# Patient Record
Sex: Male | Born: 1960 | Race: White | Hispanic: No | Smoking: Never smoker
Health system: Southern US, Community
[De-identification: ages and names within clinical notes are randomized; demographics above are authoritative.]

## PROBLEM LIST (undated history)

## (undated) DIAGNOSIS — I10 Essential (primary) hypertension: Secondary | ICD-10-CM

## (undated) DIAGNOSIS — E119 Type 2 diabetes mellitus without complications: Secondary | ICD-10-CM

## (undated) HISTORY — PX: BACK SURGERY: SHX140

## (undated) HISTORY — PX: TONSILLECTOMY: SUR1361

---

## 2007-12-24 ENCOUNTER — Ambulatory Visit (HOSPITAL_COMMUNITY): Admission: RE | Admit: 2007-12-24 | Discharge: 2007-12-25 | Payer: Self-pay | Admitting: Anesthesiology

## 2009-02-19 IMAGING — CR DG LUMBAR SPINE 2-3V
1 series · 1 of 1 positions shown · non-contrast
Comparison: none

CLINICAL DATA: L5-S1 diskectomy.
 PORTABLE LATERAL LUMBAR SPINE - 1 VIEW:

[view not recorded]
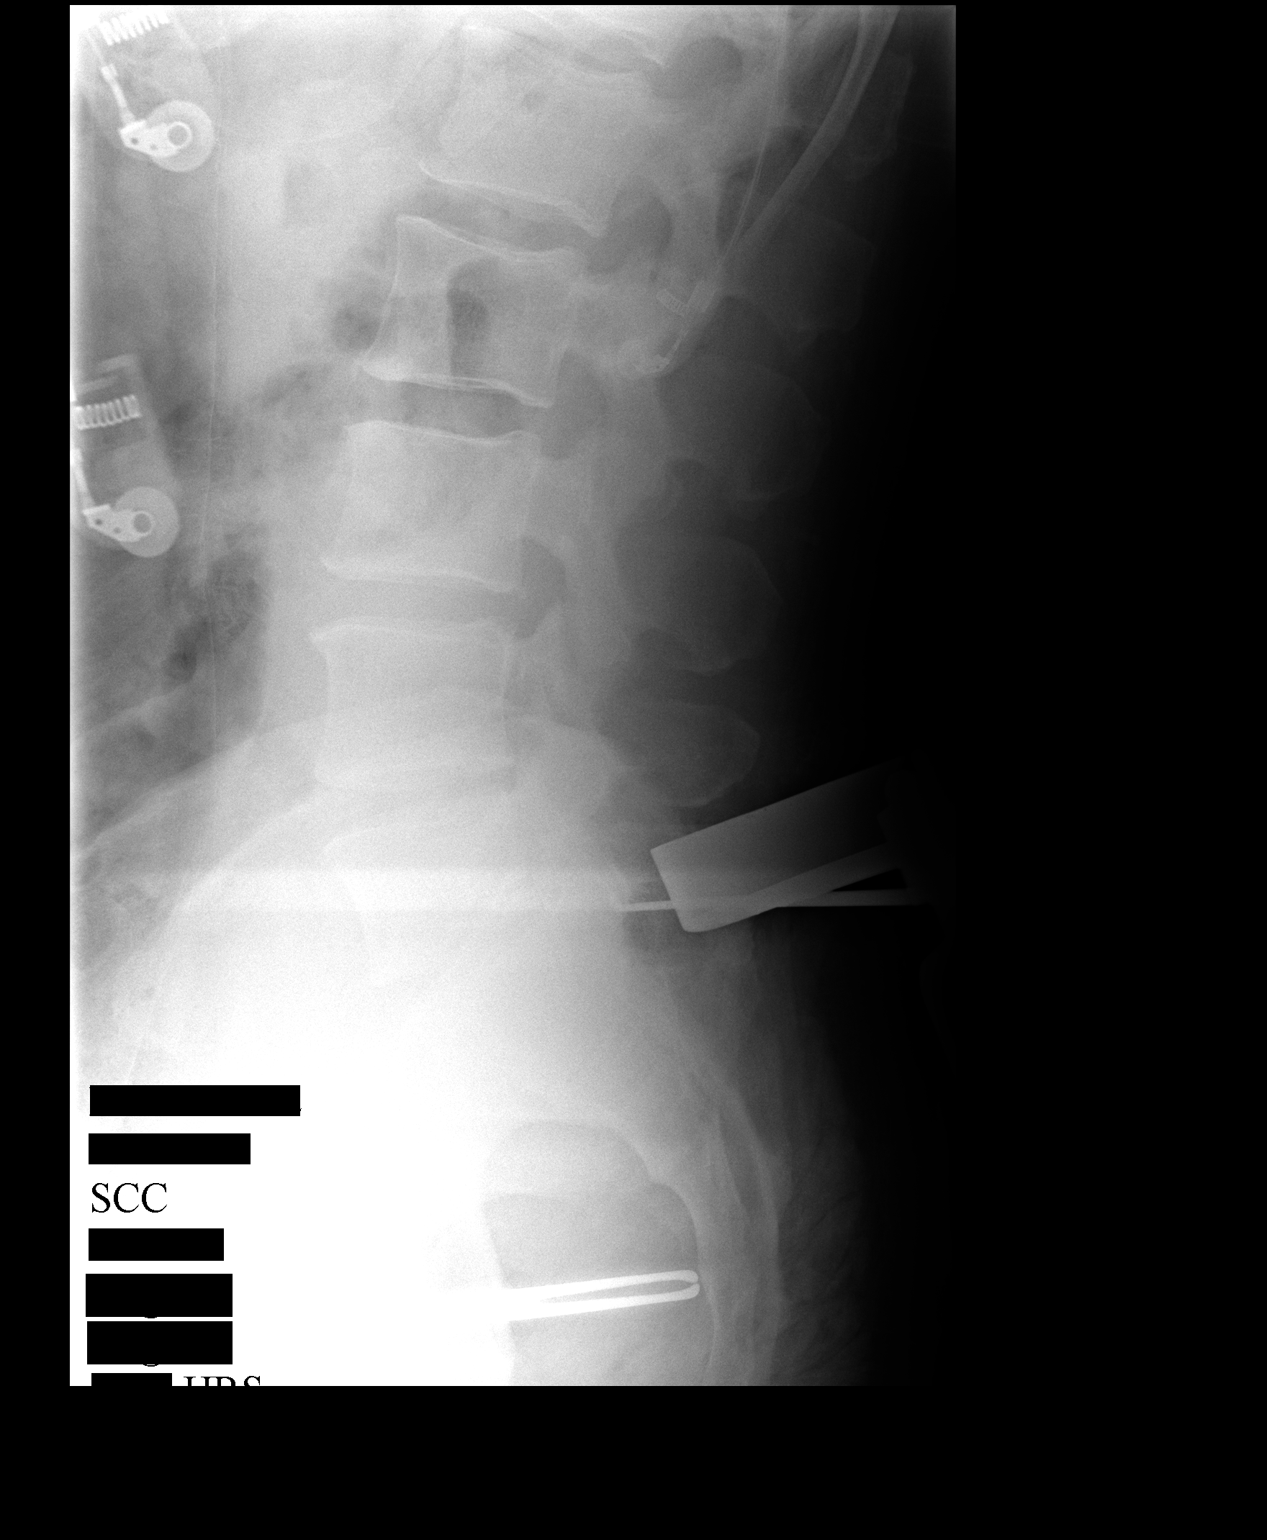

[1 of 1 positions shown; findings below may reference images not displayed]

FINDINGS: A single film shows tissue spreaders in place posteriorly with a probe directed at the last disc space level.
IMPRESSION: As discussed above.

## 2011-04-03 NOTE — Op Note (Signed)
NAMEDEMETRES, PROCHNOW NO.:  [id number]   MEDICAL RECORD NO.:  [id number]          PATIENT TYPE:  OIB   LOCATION:  3599                         FACILITY:  [facility]   PHYSICIAN:  Tia Alert, MD     DATE OF BIRTH:  [birth year]   DATE OF PROCEDURE:  12/24/2007  DATE OF DISCHARGE:                               OPERATIVE REPORT   PREOPERATIVE DIAGNOSIS:  Foraminal stenosis, L5-S1, on the left with  left L5 radiculopathy.   POSTOPERATIVE DIAGNOSIS:  Foraminal stenosis, L5-S1, on the left with  left L5 radiculopathy.   PROCEDURE:  Lumbar hemilaminectomy, medial facetectomy, and wide  foraminotomy for decompression of the left L5 and S1 nerve roots  utilizing microscopic dissection.   SURGEON:  Tia Alert, MD   ASSISTANT:  Donalee Citrin, M.D.   ANESTHESIA:  General endotracheal.   COMPLICATIONS:  None apparent.   INDICATIONS FOR PROCEDURE:  Mr. Schifano is a 50 year old gentleman who  presented with a partial foot drop on the left with severe pain in his  left leg.  He had an MRI which showed significant foraminal stenosis at  L5-S1 on the left which looked like a foraminal disk herniation.  I  recommended a foraminotomy with diskectomy at L5-S1 on the left side to  decompress the left L5 and S1 nerve roots.  He understood the risks,  benefits, and expected outcome and wished to proceed.   DESCRIPTION OF PROCEDURE:  The patient was taken to operating room and  after induction of adequate generalized endotracheal anesthesia, he was  rolled into the prone position on the Wilson frame and all pressure  points were padded.  His lumbar region was prepped with DuraPrep and  then draped in the usual sterile fashion.   5 mL of local anesthesia was injected, and a dorsal midline incision was  made and carried down to the lumbosacral fascia.  The fascia was opened  on the patient's left side and taken down in a subperiosteal fashion to  expose L5-S1 on the left.  A wide  generous foraminotomy was performed at  L5-S1 on the left side.  The underlying yellow ligament was opened and  removed.  I identified the left L5 and S1 nerve roots.  A wide  foraminotomy was then performed by removing significantly more facet  than is usual for a microdiskectomy at L4-S1 on the left side.  I  followed the L5 nerve root out.  The disk was found to be not a  significant herniated disk but a large calcified herniation which was  extremely difficult to remove and decompress.  Therefore, we felt  probably the better part of valor was to simply march along the L5 nerve  root as far as we could, and we decompressed actually quite a  significant amount of the L5 nerve root by simply undercutting the facet  and the pars complex at L5-S1 on the left side.  We then palpated with a  coronary dilator distally into the foramen.  I felt no further  compression of the L5 nerve root.  It felt free.  Certainly you could  feel the osteophyte inferior to the nerve root, but the nerve root  seemed to be free and had a channel to exit the foramen, and therefore  we felt like we had done as much as we could possibly do without  performing a facetectomy on the patient which we did not want to do at  the risk of destabilizing the segment.  Therefore, we irrigated with  saline solution containing bacitracin, felt along the nerve root once  again, and it felt like it was free.  I felt we had done as much as we  could possibly do without risking injury to the root or to the dura  trying to remove the calcified disk.  Therefore, we dried all bleeding  points, lined the dura with Gelfoam, and then closed the fascia with 0  Vicryl in the subcutaneous, the subcuticular tissue with 2-0 and 3-0  Vicryl, and closed the skin with Benzoin and Steri-Strips.  The drapes  were removed.  A sterile dressing was applied.   The patient was awakened from his general anesthesia and transferred to  the recovery  room in stable condition.  At the end of procedure, all  sponge, needle, and instrument counts were correct.      Tia Alert, MD  Electronically Signed     DSJ/MEDQ  D:  12/24/2007  T:  12/25/2007  Job:  [id number]

## 2011-08-10 LAB — DIFFERENTIAL
Basophils Absolute: 0
Basophils Relative: 0
Eosinophils Absolute: 0.3
Eosinophils Relative: 4
Monocytes Absolute: 0.9

## 2011-08-10 LAB — CBC
HCT: 46.7
Hemoglobin: 16.5
MCHC: 35.2
MCV: 93.1
Platelets: 317
RDW: 13

## 2011-08-10 LAB — BASIC METABOLIC PANEL
BUN: 13
CO2: 30
GFR calc non Af Amer: >60
Glucose, Bld: 96
Potassium: 4.4
Sodium: 138

## 2011-08-10 LAB — PROTIME-INR: Prothrombin Time: 13.5

## 2015-05-13 ENCOUNTER — Emergency Department (HOSPITAL_BASED_OUTPATIENT_CLINIC_OR_DEPARTMENT_OTHER)
Admission: EM | Admit: 2015-05-13 | Discharge: 2015-05-13 | Disposition: A | Discharge status: Home / Self Care | Payer: No Typology Code available for payment source | Attending: Emergency Medicine | Admitting: Emergency Medicine

## 2015-05-13 ENCOUNTER — Encounter (HOSPITAL_BASED_OUTPATIENT_CLINIC_OR_DEPARTMENT_OTHER): Payer: Self-pay

## 2015-05-13 DIAGNOSIS — Z79899 Other long term (current) drug therapy: Secondary | ICD-10-CM | POA: Insufficient documentation

## 2015-05-13 DIAGNOSIS — S50811D Abrasion of right forearm, subsequent encounter: Secondary | ICD-10-CM | POA: Diagnosis not present

## 2015-05-13 DIAGNOSIS — S60212D Contusion of left wrist, subsequent encounter: Secondary | ICD-10-CM | POA: Diagnosis not present

## 2015-05-13 DIAGNOSIS — M25551 Pain in right hip: Secondary | ICD-10-CM | POA: Diagnosis present

## 2015-05-13 DIAGNOSIS — S8991XD Unspecified injury of right lower leg, subsequent encounter: Secondary | ICD-10-CM | POA: Diagnosis not present

## 2015-05-13 DIAGNOSIS — S40211D Abrasion of right shoulder, subsequent encounter: Secondary | ICD-10-CM | POA: Diagnosis not present

## 2015-05-13 DIAGNOSIS — E119 Type 2 diabetes mellitus without complications: Secondary | ICD-10-CM | POA: Diagnosis not present

## 2015-05-13 DIAGNOSIS — I10 Essential (primary) hypertension: Secondary | ICD-10-CM | POA: Diagnosis not present

## 2015-05-13 DIAGNOSIS — S7001XD Contusion of right hip, subsequent encounter: Secondary | ICD-10-CM | POA: Diagnosis not present

## 2015-05-13 HISTORY — DX: Type 2 diabetes mellitus without complications: E11.9

## 2015-05-13 HISTORY — DX: Essential (primary) hypertension: I10

## 2015-05-13 MED ORDER — OXYCODONE-ACETAMINOPHEN 5-325 MG PO TABS: 1.0000 | ORAL_TABLET | ORAL | Status: AC | PRN

## 2015-05-13 MED ORDER — CEPHALEXIN 500 MG PO CAPS: 500.0000 mg | ORAL_CAPSULE | Freq: Four times a day (QID) | ORAL | Status: AC

## 2015-05-13 NOTE — Discharge Instructions (Signed)
Take the prescribed medication as directed. Follow-up with [facility]. Return to the ED for new or worsening symptoms.
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
